# Patient Record
Sex: Male | Born: 1960 | Race: White | Hispanic: No | Marital: Married | Smoking: Former smoker
Health system: Southern US, Community
[De-identification: ages and names within clinical notes are randomized; demographics above are authoritative.]

## PROBLEM LIST (undated history)

## (undated) DIAGNOSIS — N50819 Testicular pain, unspecified: Secondary | ICD-10-CM

## (undated) DIAGNOSIS — E785 Hyperlipidemia, unspecified: Secondary | ICD-10-CM

## (undated) DIAGNOSIS — N451 Epididymitis: Secondary | ICD-10-CM

## (undated) DIAGNOSIS — K219 Gastro-esophageal reflux disease without esophagitis: Secondary | ICD-10-CM

## (undated) DIAGNOSIS — T7840XA Allergy, unspecified, initial encounter: Secondary | ICD-10-CM

## (undated) HISTORY — DX: Allergy, unspecified, initial encounter: T78.40XA

## (undated) HISTORY — DX: Testicular pain, unspecified: N50.819

## (undated) HISTORY — DX: Hyperlipidemia, unspecified: E78.5

## (undated) HISTORY — DX: Epididymitis: N45.1

## (undated) HISTORY — DX: Gastro-esophageal reflux disease without esophagitis: K21.9

---

## 1990-01-27 HISTORY — PX: VASECTOMY: SHX75

## 1992-01-28 HISTORY — PX: NASAL SINUS SURGERY: SHX719

## 2008-10-13 ENCOUNTER — Emergency Department: Payer: Self-pay | Admitting: Emergency Medicine

## 2008-11-24 ENCOUNTER — Ambulatory Visit: Payer: Self-pay | Admitting: Otolaryngology

## 2011-09-26 ENCOUNTER — Ambulatory Visit: Payer: Self-pay | Admitting: Unknown Physician Specialty

## 2012-07-20 ENCOUNTER — Ambulatory Visit: Payer: Self-pay | Admitting: Unknown Physician Specialty

## 2013-02-25 ENCOUNTER — Ambulatory Visit: Payer: Self-pay | Admitting: Gastroenterology

## 2013-03-01 LAB — PATHOLOGY REPORT

## 2013-05-23 ENCOUNTER — Ambulatory Visit: Payer: Self-pay | Admitting: Obstetrics and Gynecology

## 2014-05-19 NOTE — Op Note (Signed)
PATIENT NAME:  Marc MoulderSYLVAIN, [name] MR#:  [id number] DATE OF BIRTH:  06/13/1960  DATE OF PROCEDURE:  07/20/2012  PREOPERATIVE DIAGNOSES: Uvular hypertrophy, chronic tonsillitis.  POSTOPERATIVE DIAGNOSES:  Uvular hypertrophy, chronic tonsillitis.  PROCEDURES PERFORMED: 1.  Uvulopalatopharyngoplasty.  2.  Tonsillectomy.   OPERATIVE FINDINGS: Elongated edematous uvula and large cryptic inflamed tonsils.   DESCRIPTION OF PROCEDURE: Ebony HailDarren was identified in the holding area, taken to the operating room and placed in the supine position. After general endotracheal anesthesia, the table was turned 45 degrees. The patient was draped in the usual fashion for tonsillectomy.  A mouth gag was inserted in the oral cavity. Examination of the oropharynx showed an elongated, edematous uvula approximately 2-1/2 times the normal size. He was also noted to have chronic cryptic tonsillitis that was also involving portions of the soft palate. Based on this examination, I felt that tonsillectomy would be warranted. Therefore, I got Mrs. Corson on the phone, described the situation to her and recommended that we proceed with tonsillectomy in addition to the palatoplasty. I described the risks and benefits including bleeding and infection and she wanted us to proceed. Therefore, beginning on the left-hand side, a tenaculum was used to grasp the tonsil and the Bovie cautery used to dissect it free from the fossa. There were several small bleeding vessels which were cauterized. The tonsil was then taken from the anterior pillar to the posterior pillar in a beveled fashion.  In a similar fashion, the right tonsil was removed. Again, meticulous hemostasis was achieved using the Bovie cautery. With the tonsils removed, the 15 blade was then used to create an incision from the anterior tonsillar pillar on the left to the anterior tonsillar pillar on the right. This incision was then beveled through the uvular portion of the uvular  muscle and beveled posteriorly to the posterior tonsillar pillar.  The uvula was thus removed. A pharyngoplasty was then created by using 4-0 Vicryls in interrupted fashion beginning from posteriorly bringing the posterior pillar and posterior mucosal edge up to the anterior edge and sewing the pharyngeal mucosa back together, including the anterior-posterior tonsillar pillars laterally. This gave excellent removal of the uvula and beautiful closure of the palate. The pharynx was then copiously irrigated with saline. Any small bleeding points were cauterized using the suction cautery. A local anesthetic of 0.5% plain Marcaine was then used to inject the soft palate as well as the tonsillar fossa. A total of 6 mL was used. The patient was then returned to anesthesia where he was awakened in the operating room and taken to the recovery room in stable condition.   CULTURES: None.   SPECIMENS: Tonsils and uvula.   ESTIMATED BLOOD LOSS: Less than 30 mL. ____________________________ Davina Pokehapman T. Corby Villasenor, MD ctm:sb D: 07/20/2012 08:15:57 ET T: 07/20/2012 10:03:25 ET JOB#: [id number]  cc: Davina Pokehapman T. Dekker Verga, MD, <Dictator> Davina PokeHAPMAN T Mozell Haber MD ELECTRONICALLY SIGNED 08/13/2012 7:53

## 2014-11-23 ENCOUNTER — Other Ambulatory Visit: Payer: Self-pay | Admitting: Unknown Physician Specialty

## 2014-11-23 DIAGNOSIS — H9123 Sudden idiopathic hearing loss, bilateral: Secondary | ICD-10-CM

## 2014-12-06 ENCOUNTER — Ambulatory Visit
Admission: RE | Admit: 2014-12-06 | Discharge: 2014-12-06 | Disposition: A | Discharge status: Home / Self Care | Payer: BLUE CROSS/BLUE SHIELD | Source: Ambulatory Visit | Attending: Unknown Physician Specialty | Admitting: Unknown Physician Specialty

## 2014-12-06 DIAGNOSIS — H9123 Sudden idiopathic hearing loss, bilateral: Secondary | ICD-10-CM

## 2014-12-20 ENCOUNTER — Other Ambulatory Visit: Payer: Self-pay | Admitting: Unknown Physician Specialty

## 2014-12-20 DIAGNOSIS — H9123 Sudden idiopathic hearing loss, bilateral: Secondary | ICD-10-CM

## 2014-12-22 ENCOUNTER — Ambulatory Visit
Admission: RE | Admit: 2014-12-22 | Discharge: 2014-12-22 | Disposition: A | Discharge status: Home / Self Care | Payer: BLUE CROSS/BLUE SHIELD | Source: Ambulatory Visit | Attending: Unknown Physician Specialty | Admitting: Unknown Physician Specialty

## 2014-12-22 DIAGNOSIS — J322 Chronic ethmoidal sinusitis: Secondary | ICD-10-CM | POA: Diagnosis not present

## 2014-12-22 DIAGNOSIS — H9123 Sudden idiopathic hearing loss, bilateral: Secondary | ICD-10-CM | POA: Diagnosis not present

## 2014-12-22 MED ORDER — GADOBENATE DIMEGLUMINE 529 MG/ML IV SOLN
20.0000 mL | Freq: Once | INTRAVENOUS | Status: AC | PRN
  Administered 2014-12-22: 20 mL via INTRAVENOUS

## 2015-02-06 ENCOUNTER — Encounter: Payer: Self-pay | Admitting: *Deleted

## 2015-02-06 DIAGNOSIS — K219 Gastro-esophageal reflux disease without esophagitis: Secondary | ICD-10-CM | POA: Insufficient documentation

## 2015-02-06 DIAGNOSIS — T7840XA Allergy, unspecified, initial encounter: Secondary | ICD-10-CM | POA: Insufficient documentation

## 2015-02-06 DIAGNOSIS — E785 Hyperlipidemia, unspecified: Secondary | ICD-10-CM | POA: Insufficient documentation

## 2015-02-06 HISTORY — DX: Allergy, unspecified, initial encounter: T78.40XA

## 2015-02-06 HISTORY — DX: Gastro-esophageal reflux disease without esophagitis: K21.9

## 2015-02-06 HISTORY — DX: Hyperlipidemia, unspecified: E78.5

## 2015-02-07 ENCOUNTER — Encounter: Payer: Self-pay | Admitting: Obstetrics and Gynecology

## 2015-02-07 ENCOUNTER — Ambulatory Visit (INDEPENDENT_AMBULATORY_CARE_PROVIDER_SITE_OTHER): Payer: BLUE CROSS/BLUE SHIELD | Admitting: Obstetrics and Gynecology

## 2015-02-07 VITALS — BP 137/90 | HR 77 | Resp 16 | Ht 69.0 in | Wt 252.3 lb

## 2015-02-07 DIAGNOSIS — N503 Cyst of epididymis: Secondary | ICD-10-CM

## 2015-02-07 DIAGNOSIS — N50819 Testicular pain, unspecified: Secondary | ICD-10-CM | POA: Diagnosis not present

## 2015-02-07 LAB — URINALYSIS, COMPLETE
Bilirubin, UA: NEGATIVE
GLUCOSE, UA: NEGATIVE
KETONES UA: NEGATIVE
LEUKOCYTES UA: NEGATIVE
NITRITE UA: NEGATIVE
PROTEIN UA: NEGATIVE
SPEC GRAV UA: 1.02 (ref 1.005–1.030)
Urobilinogen, Ur: 0.2 mg/dL (ref 0.2–1.0)
pH, UA: 5.5 (ref 5.0–7.5)

## 2015-02-07 LAB — MICROSCOPIC EXAMINATION
BACTERIA UA: NONE SEEN
WBC, UA: NONE SEEN /hpf (ref 0–?)

## 2015-02-07 NOTE — Progress Notes (Signed)
02/07/2015 10:55 AM   Jermaine Hulen LusterP Weems 06-Oct-1960 [id number]  Referring provider: Jerl MinaJames Hedrick, MD [address] [facility] [place], [place] [id number]  Chief Complaint  Patient presents with  . Testicle Pain    HPI:  Shows a 55 year old male with a history of chronic intermittent left testicular pain. He has been previously diagnosed multiple times with epididymitis. He was seen in our clinic almost 2 years ago for evaluation of same scrotal ultrasound was performed noting a small left epididymal/possible spermatocele. Was no evidence of epididymitis on his ultrasound at that time. He describes pain as intermittent soreness. He reports that symptoms are worse after he rides in the car for long periods of time. He denies any fevers or urinary symptoms. He is wondering if he should continue to be prescribed antibiotics or if they are truly necessary. He is not taking any other medications for his intermittent discomfort.   Past surgical history includes vasectomy.   PMH: Past Medical History  Diagnosis Date  . Allergic state 02/06/2015  . Acid reflux 02/06/2015  . HLD (hyperlipidemia) 02/06/2015  . Testicular pain   . Epididymitis     Surgical History: Past Surgical History  Procedure Laterality Date  . Vasectomy  1992  . Nasal sinus surgery  1994    Home Medications:    Medication List       This list is accurate as of: 02/07/15 11:59 PM.  Always use your most recent med list.               CRESTOR 20 MG tablet  Generic drug:  rosuvastatin  Take by mouth.     lansoprazole 15 MG capsule  Commonly known as:  PREVACID  Take by mouth.     sertraline 50 MG tablet  Commonly known as:  ZOLOFT  Take by mouth.        Allergies:  Allergies  Allergen Reactions  . Iodinated Diagnostic Agents Anaphylaxis  . Codeine     Other reaction(s): Unknown    Family History: Family History  Problem Relation Age of Onset  . Colon cancer Father     Social History:   reports that he quit smoking about 4 years ago. He does not have any smokeless tobacco history on file. His alcohol and drug histories are not on file.  ROS: UROLOGY Frequent Urination?: No Hard to postpone urination?: No Burning/pain with urination?: No Get up at night to urinate?: No Leakage of urine?: No Urine stream starts and stops?: No Trouble starting stream?: No Do you have to strain to urinate?: No Blood in urine?: No Urinary tract infection?: No Sexually transmitted disease?: No Injury to kidneys or bladder?: No Painful intercourse?: No Weak stream?: No Erection problems?: No Penile pain?: No  Gastrointestinal Nausea?: No Vomiting?: No Indigestion/heartburn?: No Diarrhea?: No Constipation?: No  Constitutional Fever: No Night sweats?: No Weight loss?: No Fatigue?: No  Skin Skin rash/lesions?: No Itching?: No  Eyes Blurred vision?: No Double vision?: No  Ears/Nose/Throat Sore throat?: No Sinus problems?: No  Hematologic/Lymphatic Swollen glands?: No Easy bruising?: No  Cardiovascular Leg swelling?: No Chest pain?: No  Respiratory Cough?: No Shortness of breath?: No  Endocrine Excessive thirst?: No  Musculoskeletal Back pain?: No Joint pain?: No  Neurological Headaches?: No Dizziness?: No  Psychologic Depression?: No Anxiety?: No  Physical Exam: BP 137/90 mmHg  Pulse 77  Resp 16  Ht 5\' 9"  (1.753 m)  Wt 252 lb 4.8 oz (114.443 kg)  BMI 37.24 kg/m2  Constitutional:  Alert and oriented, No acute distress. HEENT: [place] AT, moist mucus membranes.  Trachea midline, no masses. Cardiovascular: No clubbing, cyanosis, or edema. Respiratory: Normal respiratory effort, no increased work of breathing. GI: Abdomen is soft, nontender, nondistended, no abdominal masses GU: Normal circumcised phallus, testicles descended bilaterally, small palpable well-circumscribed rubbery mass palpable superior to left testicle, minimal tenderness noted, no  scrotal erythema or swelling Skin: No rashes, bruises or suspicious lesions. Neurologic: Grossly intact, no focal deficits, moving all 4 extremities. Psychiatric: Normal mood and affect.  Laboratory Data:   Urinalysis    Component Value Date/Time   GLUCOSEU Negative 02/07/2015 1419   BILIRUBINUR Negative 02/07/2015 1419   NITRITE Negative 02/07/2015 1419   LEUKOCYTESUR Negative 02/07/2015 1419    Pertinent Imaging:  Assessment & Plan:    1.Scrotal pain-  Scrotal US 1 year ago demonstrated left epididymal cyst vs spermatocele.  It has remained stable in size but patient continues to experience intermittent pain especially after long bumpy car rides.  Patient reassured that his intermittent scrotal pain is most likely due to his small epididymal cyst/spermatocele seen on previous scrotal ultrasound. I recommended that he take anti-inflammatories and apply cool compresses as needed. It is unlikely that his symptoms are due to recurrent epididymitis given that he does not experience any increased swelling during these episodes or more severe pain. I encouraged him to continue to monitor the area and return if symptoms worsen. He understands that surgical removal is an option in the future should he become very bothersome or more severe.  There are no diagnoses linked to this encounter.  Return if symptoms worsen or fail to improve.  These notes generated with voice recognition software. I apologize for typographical errors.  Earlie Lou, FNP  [facility] [address] [place], [place] [address] [phone]

## 2015-08-28 ENCOUNTER — Emergency Department: Payer: BLUE CROSS/BLUE SHIELD

## 2015-08-28 ENCOUNTER — Ambulatory Visit (INDEPENDENT_AMBULATORY_CARE_PROVIDER_SITE_OTHER)
Admission: EM | Admit: 2015-08-28 | Discharge: 2015-08-28 | Disposition: A | Discharge status: Home / Self Care | Payer: BLUE CROSS/BLUE SHIELD | Source: Home / Self Care | Attending: Family Medicine | Admitting: Family Medicine

## 2015-08-28 ENCOUNTER — Emergency Department
Admission: EM | Admit: 2015-08-28 | Discharge: 2015-08-28 | Disposition: A | Discharge status: Home / Self Care | Payer: BLUE CROSS/BLUE SHIELD | Attending: Emergency Medicine | Admitting: Emergency Medicine

## 2015-08-28 ENCOUNTER — Encounter: Payer: Self-pay | Admitting: Medical Oncology

## 2015-08-28 DIAGNOSIS — Z885 Allergy status to narcotic agent status: Secondary | ICD-10-CM | POA: Insufficient documentation

## 2015-08-28 DIAGNOSIS — Z87891 Personal history of nicotine dependence: Secondary | ICD-10-CM | POA: Insufficient documentation

## 2015-08-28 DIAGNOSIS — Z79899 Other long term (current) drug therapy: Secondary | ICD-10-CM | POA: Insufficient documentation

## 2015-08-28 DIAGNOSIS — Z800 Family history of malignant neoplasm of digestive organs: Secondary | ICD-10-CM | POA: Insufficient documentation

## 2015-08-28 DIAGNOSIS — R079 Chest pain, unspecified: Secondary | ICD-10-CM | POA: Diagnosis not present

## 2015-08-28 DIAGNOSIS — R0789 Other chest pain: Secondary | ICD-10-CM

## 2015-08-28 DIAGNOSIS — K219 Gastro-esophageal reflux disease without esophagitis: Secondary | ICD-10-CM | POA: Insufficient documentation

## 2015-08-28 DIAGNOSIS — E785 Hyperlipidemia, unspecified: Secondary | ICD-10-CM

## 2015-08-28 LAB — TROPONIN I

## 2015-08-28 LAB — BASIC METABOLIC PANEL
ANION GAP: 7 (ref 5–15)
BUN: 16 mg/dL (ref 6–20)
CALCIUM: 9.6 mg/dL (ref 8.9–10.3)
CHLORIDE: 109 mmol/L (ref 101–111)
CO2: 24 mmol/L (ref 22–32)
Creatinine, Ser: 0.92 mg/dL (ref 0.61–1.24)
GFR calc non Af Amer: >60 mL/min (ref >60)
Glucose, Bld: 139 mg/dL — ABNORMAL HIGH (ref 65–99)
POTASSIUM: 4.1 mmol/L (ref 3.5–5.1)
Sodium: 140 mmol/L (ref 135–145)

## 2015-08-28 LAB — CBC
HEMATOCRIT: 45.2 % (ref 40.0–52.0)
HEMOGLOBIN: 16 g/dL (ref 13.0–18.0)
MCH: 30.5 pg (ref 26.0–34.0)
MCHC: 35.4 g/dL (ref 32.0–36.0)
MCV: 86.2 fL (ref 80.0–100.0)
Platelets: 155 10*3/uL (ref 150–440)
RBC: 5.24 MIL/uL (ref 4.40–5.90)
RDW: 12.7 % (ref 11.5–14.5)
WBC: 7.7 10*3/uL (ref 3.8–10.6)

## 2015-08-28 MED ORDER — ASPIRIN 81 MG PO CHEW
324.0000 mg | CHEWABLE_TABLET | Freq: Once | ORAL | Status: AC
  Administered 2015-08-28: 324 mg via ORAL

## 2015-08-28 NOTE — Discharge Instructions (Signed)
Please seek medical attention for any high fevers, chest pain, shortness of breath, change in behavior, persistent vomiting, bloody stool or any other new or concerning symptoms.  

## 2015-08-28 NOTE — ED Triage Notes (Signed)
Patient complains of chest pain that started yesterday around 6 pm. Patient states that pain kept him up all night. Patient states that the chest pain is loacted in his central chest and radiates into upper chest. Patient states that has been also having some back pain.

## 2015-08-28 NOTE — ED Provider Notes (Signed)
[facility]    CSN: [id number] Arrival date & time: 08/28/15  1355  First Provider Contact:  First MD Initiated Contact with Patient 08/28/15 1434        History   Chief Complaint Chief Complaint  Patient presents with  . Chest Pain    HPI Marc Frye is a 55 y.o. male.   Patient's here because of chest pain. He states that he's had midepigastric chest pain that does radiate somewhat to the back. He states this morning on off-and-on for 4 days last night apparently was more intense and he was unable to sleep last night. He denies any shortness of breath injury to his chest previous history of chest pain. No known cardiac disease or cardiac problems. He does state he has a bad history of GERD he does have hyperlipidemia and his had a history of epididymitis and testicular pain. He states that he's been taking Zantac but has not helped him any for this pain. He states the massage the area feels like he can reproduce the pain. Family medical history he is not sure of his past family medical history states he moved away at young man. He states his father did have colon cancer and a nonmalignant brain tumor No known drug allergies however he does have hyperlipidemia he does not have hypertension he used to smoke but stopped about 3 years ago.  He describes the pain now being about 5 out of 7.     The history is provided by the patient and the spouse.  Chest Pain    Past Medical History:  Diagnosis Date  . Acid reflux 02/06/2015  . Allergic state 02/06/2015  . Epididymitis   . HLD (hyperlipidemia) 02/06/2015  . Testicular pain     Patient Active Problem List   Diagnosis Date Noted  . Allergic state 02/06/2015  . Acid reflux 02/06/2015  . HLD (hyperlipidemia) 02/06/2015    Past Surgical History:  Procedure Laterality Date  . NASAL SINUS SURGERY  1994  . VASECTOMY  1992       Home Medications    Prior to Admission medications   Medication Sig Start  Date End Date Taking? Authorizing Provider  lansoprazole (PREVACID) 15 MG capsule Take by mouth.   Yes Historical Provider, MD  rosuvastatin (CRESTOR) 20 MG tablet Take by mouth. 12/14/14 12/14/15 Yes Historical Provider, MD  sertraline (ZOLOFT) 50 MG tablet Take by mouth. 07/20/14  Yes Historical Provider, MD    Family History Family History  Problem Relation Age of Onset  . Colon cancer Father     Social History Social History  Substance Use Topics  . Smoking status: Former Smoker    Quit date: 02/06/2011  . Smokeless tobacco: Never Used  . Alcohol use No     Allergies   Iodinated diagnostic agents and Codeine   Review of Systems Review of Systems  Cardiovascular: Positive for chest pain.  All other systems reviewed and are negative.    Physical Exam Triage Vital Signs ED Triage Vitals  Enc Vitals Group     BP 08/28/15 1421 (!) 154/79     Pulse Rate 08/28/15 1421 77     Resp 08/28/15 1421 17     Temp 08/28/15 1421 98.1 F (36.7 C)     Temp Source 08/28/15 1421 Oral     SpO2 08/28/15 1421 98 %     Weight 08/28/15 1421 260 lb (117.9 kg)     Height 08/28/15 1421 5\' 9"  (  1.753 m)     Head Circumference --      Peak Flow --      Pain Score 08/28/15 1423 5     Pain Loc --      Pain Edu? --      Excl. in GC? --    No data found.   Updated Vital Signs BP (!) 154/79   Pulse 77   Temp 98.1 F (36.7 C) (Oral)   Resp 17   Ht  (1.753 m)   Wt 260 lb (117.9 kg)   SpO2 98%   BMI 38.40 kg/m   Visual Acuity Right Eye Distance:   Left Eye Distance:   Bilateral Distance:    Right Eye Near:   Left Eye Near:    Bilateral Near:     Physical Exam  Constitutional: He is oriented to person, place, and time. He appears well-developed and well-nourished. No distress.  HENT:  Head: Normocephalic and atraumatic.  Eyes: Pupils are equal, round, and reactive to light.  Neck: Normal range of motion. Neck supple.  Cardiovascular: Normal rate and regular rhythm.     No murmur heard. Pulmonary/Chest: Effort normal and breath sounds normal.    Patient has mid epigastric pain and tenderness over the lower sternum consistent with the chest pain that he was having  Abdominal: Soft.  Musculoskeletal: Normal range of motion.  Neurological: He is alert and oriented to person, place, and time.  Skin: Skin is warm. He is not diaphoretic.  Psychiatric: He has a normal mood and affect.     UC Treatments / Results  Labs (all labs ordered are listed, but only abnormal results are displayed) Labs Reviewed - No data to display  EKG  EKG Interpretation None      ED ECG REPORT I, Perle Gibbon H, the attending physician, personally viewed and interpreted this ECG.   Date: 08/28/2015  EKG Time:14:21:33  Rate:73  Rhythm: normal EKG, normal sinus rhythm, there are no previous tracings available for comparison  Axis: 44  Intervals:none  ST&T Change: none Radiology No results found.  Procedures Procedures (including critical care time)  Medications Ordered in UC Medications  aspirin chewable tablet 324 mg (324 mg Oral Given 08/28/15 1518)     Initial Impression / Assessment and Plan / UC Course  I have reviewed the triage vital signs and the nursing notes.  Pertinent labs & imaging results that were available during my care of the patient were reviewed by me and considered in my medical decision making (see chart for details).  Clinical Course    I've explained patient and his wife that I think he has costochondritis and chest wall tenderness Y I cannot explain it but best what I think. However of also complained to him that he is a 55 year old white male. Generally white males over 3540, max chest pain UC for those people to the ER since this is urgent care. Explained that the standard of care would Rj Pedrosa tiled aren't pale these 2 negative cardiac enzymes to make sure this been no heart damage. Was EKG was normal and it does not appear that he is  in any cardiac distress explained to him I cannot give him that reassurance.  Patient wife given option of treating him for costochondritis or going to the emergency room to make sure that there is no cardiac problem and they've opted to go to the emergency room of the choice which is [facility]. Judeth Cornfield charge nurse [facility] ED notify  Ann of patient's eminent arrival via private car.  Final Clinical Impressions(s) / UC Diagnoses   Final diagnoses:  Chest pain, unspecified chest pain type  Chest wall pain    New Prescriptions Discharge Medication List as of 08/28/2015  3:20 PM       Hassan Rowan, MD 08/28/15 1539

## 2015-08-28 NOTE — ED Triage Notes (Signed)
Pt reports that he has been having central chest heaviness that began about 4 days ago and has progressively worsened. Pt reports pain radiates into his back. Denies sob. Was seen at urgent care pta.

## 2015-08-28 NOTE — ED Provider Notes (Signed)
[facility] Emergency Department Provider Note    ____________________________________________   I have reviewed the triage vital signs and the nursing notes.   HISTORY  Chief Complaint Chest Pain   History limited by: Not Limited   HPI Marc Frye is a 55 y.o. male who presents to the emergency department today because of concerns for chest pain. He was seen earlier at urgent care. They sent him to the emergency department. He states that the pain is been going on for 4 days. It is located in his lower mid chest. He states that prior to the pain he was moving floor tiles and a 40 pound dog. He denies any associated shortness breath. No diaphoresis. No pain in his neck or arms.No fevers or shortness breath.     Past Medical History:  Diagnosis Date  . Acid reflux 02/06/2015  . Allergic state 02/06/2015  . Epididymitis   . HLD (hyperlipidemia) 02/06/2015  . Testicular pain     Patient Active Problem List   Diagnosis Date Noted  . Allergic state 02/06/2015  . Acid reflux 02/06/2015  . HLD (hyperlipidemia) 02/06/2015    Past Surgical History:  Procedure Laterality Date  . NASAL SINUS SURGERY  1994  . VASECTOMY  1992    Prior to Admission medications   Medication Sig Start Date End Date Taking? Authorizing Provider  lansoprazole (PREVACID) 15 MG capsule Take by mouth.    Historical Provider, MD  rosuvastatin (CRESTOR) 20 MG tablet Take by mouth. 12/14/14 12/14/15  Historical Provider, MD  sertraline (ZOLOFT) 50 MG tablet Take by mouth. 07/20/14   Historical Provider, MD    Allergies Iodinated diagnostic agents and Codeine  Family History  Problem Relation Age of Onset  . Colon cancer Father     Social History Social History  Substance Use Topics  . Smoking status: Former Smoker    Quit date: 02/06/2011  . Smokeless tobacco: Never Used  . Alcohol use No    Review of Systems  Constitutional: Negative for fever. Cardiovascular:  Positive for chest pain Respiratory: Negative for shortness of breath. Gastrointestinal: Negative for abdominal pain, vomiting and diarrhea. Neurological: Negative for headaches, focal weakness or numbness.   10-point ROS otherwise negative.  ____________________________________________   PHYSICAL EXAM:  VITAL SIGNS: ED Triage Vitals  Enc Vitals Group     BP 08/28/15 1550 (!) 156/93     Pulse Rate 08/28/15 1550 73     Resp 08/28/15 1550 20     Temp 08/28/15 1550 98.5 F (36.9 C)     Temp Source 08/28/15 1550 Oral     SpO2 08/28/15 1550 98 %     Weight 08/28/15 1550 260 lb (117.9 kg)     Height 08/28/15 1550  (1.753 m)     Head Circumference --      Peak Flow --      Pain Score 08/28/15 1555 5   Constitutional: Alert and oriented. Well appearing and in no distress. Eyes: Conjunctivae are normal. PERRL. Normal extraocular movements. ENT   Head: Normocephalic and atraumatic.   Nose: No congestion/rhinnorhea.   Mouth/Throat: Mucous membranes are moist.   Neck: No stridor. Hematological/Lymphatic/Immunilogical: No cervical lymphadenopathy. Cardiovascular: Normal rate, regular rhythm.  No murmurs, rubs, or gallops. Respiratory: Normal respiratory effort without tachypnea nor retractions. Breath sounds are clear and equal bilaterally. No wheezes/rales/rhonchi. Gastrointestinal: Soft and nontender. No distention. There is no CVA tenderness. Genitourinary: Deferred Musculoskeletal: Normal range of motion in all extremities. No joint  effusions.  No lower extremity tenderness nor edema. Tender to palpation over the xiphoid process. This does reproduce the pain the patient has been experiencing. Neurologic:  Normal speech and language. No gross focal neurologic deficits are appreciated.  Skin:  Skin is warm, dry and intact. No rash noted. Psychiatric: Mood and affect are normal. Speech and behavior are normal. Patient exhibits appropriate insight and  judgment.  ____________________________________________    LABS (pertinent positives/negatives)  Labs Reviewed  BASIC METABOLIC PANEL - Abnormal; Notable for the following:       Result Value   Glucose, Bld 139 (*)    All other components within normal limits  CBC  TROPONIN I     ____________________________________________   EKG  I, Phineas Semen, attending physician, personally viewed and interpreted this EKG  EKG Time: 1550 Rate: 72 Rhythm: normal sinus rhythm Axis: normal Intervals: qtc 405 QRS: narrow ST changes: no st eelvation Impression: normal ekg   ____________________________________________    RADIOLOGY  CXR IMPRESSION: No active cardiopulmonary disease.  ____________________________________________   PROCEDURES  Procedures  ____________________________________________   INITIAL IMPRESSION / ASSESSMENT AND PLAN / ED COURSE  Pertinent labs & imaging results that were available during my care of the patient were reviewed by me and considered in my medical decision making (see chart for details).  She presents from urgent care today because of concerns for chest pain. It has been going on for the past 4 days. It is worse with palpation to the xiphoid process. At this point I doubt ACS given negative troponin and EKG. Given that the pain has been going on for 4 days I would expect some elevation of the troponin at this point. The patient is tender over the xiphoid process. I think likely costochondritis. I discussed this with the patient. I additionally discussed following up with primary care. ____________________________________________   FINAL CLINICAL IMPRESSION(S) / ED DIAGNOSES  Final diagnoses:  Chest wall pain     Note: This dictation was prepared with Dragon dictation. Any transcriptional errors that result from this process are unintentional    Phineas Semen, MD 08/28/15 1742

## 2017-08-02 IMAGING — MR MR BRAIN/IAC WO/W
10 of 11 series · 40 of 48 positions shown · IV contrast (multihance)
Comparison: None.

CLINICAL DATA: Sudden onset hearing loss in both the ears for 3-4
weeks.

EXAM:
MR BRAIN/IAC WITHOUT AND WITH CONTRAST
TECHNIQUE: Multiplanar, multisequence MR imaging was performed both before and
after administration of intravenous contrast.
CONTRAST:  20mL MULTIHANCE GADOBENATE DIMEGLUMINE 529 MG/ML IV SOLN

[Series 2: T1 · sagittal · 5.0mm · 0.45mm/px · 4 of 23 slices shown (1 of 3)]
[im 1/23]
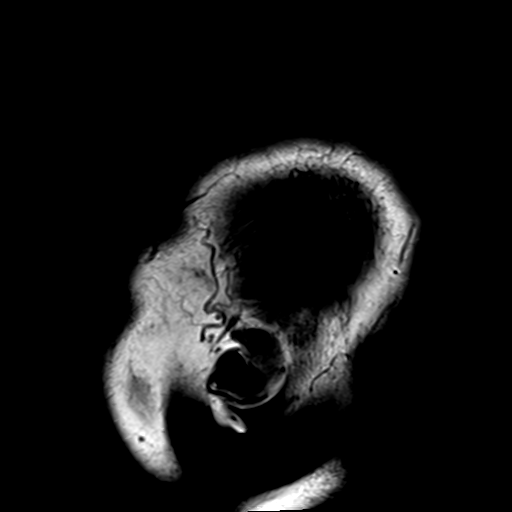
[im 8/23]
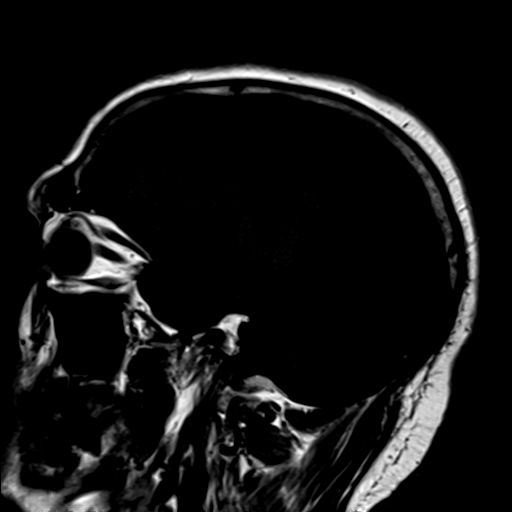
[im 15/23]
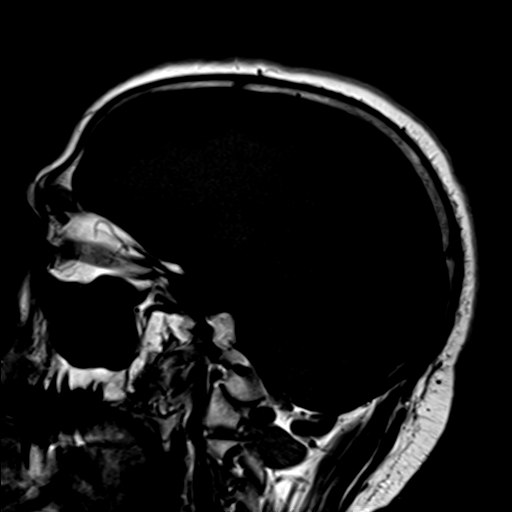
[im 23/23]
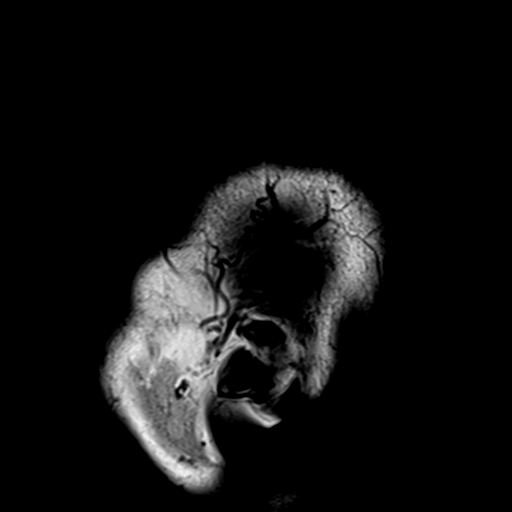

[Series 4: DWI · axial · 3.0mm · 1.20mm/px · z∈[-29,+135]mm · 9 of 57 slices shown (1 of 2)]
[im 1/57]
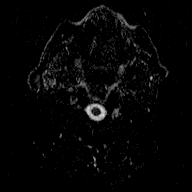
[im 8/57]
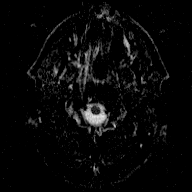
[im 15/57]
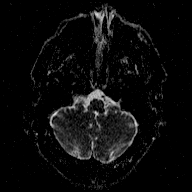
[im 22/57]
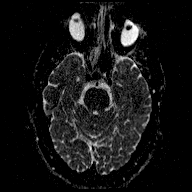
[im 29/57]
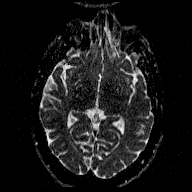
[im 36/57]
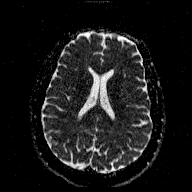
[im 43/57]
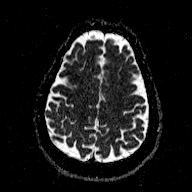
[im 50/57]
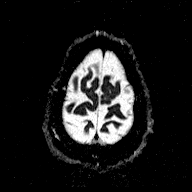
[im 57/57]
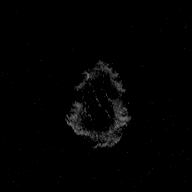

[Series 5: T2 · axial · 5.0mm · 0.72mm/px · z∈[-27,+132]mm · 3 of 26 slices shown]
[im 1/26]
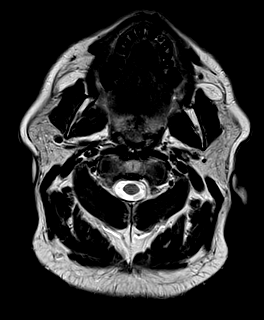
[im 13/26]
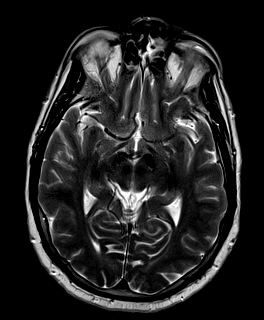
[im 26/26]
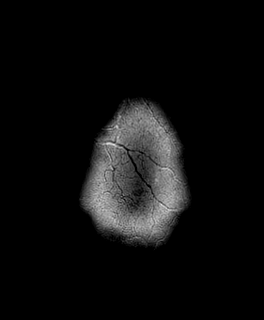

[Series 6: FLAIR · axial · 5.0mm · 0.45mm/px · z∈[-27,+132]mm · 3 of 26 slices shown]
[im 1/26]
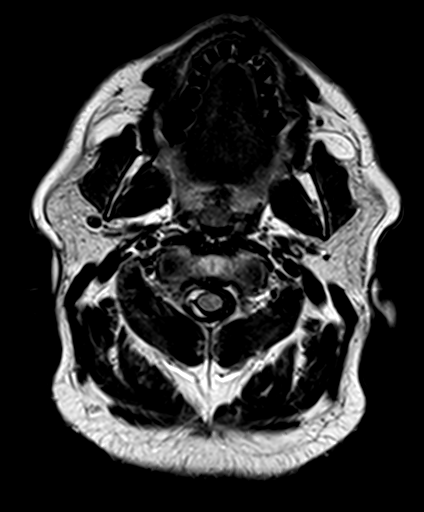
[im 13/26]
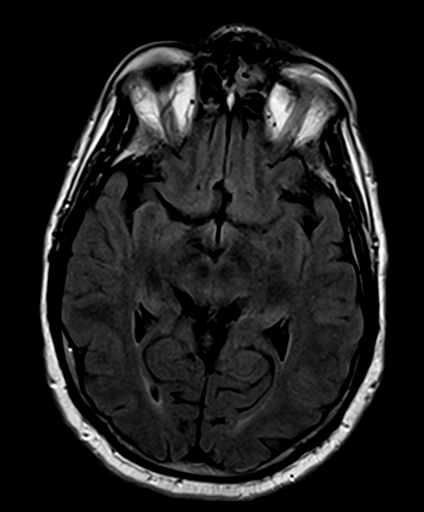
[im 26/26]
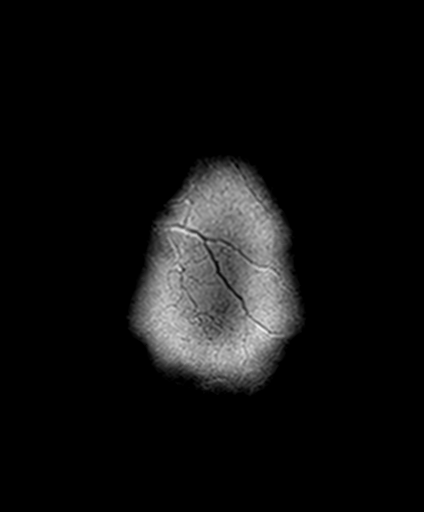

[Series 7: T1 · coronal · 3.0mm · 0.37mm/px · 1 of 11 slices shown (2 of 3)]
[im 1/11]
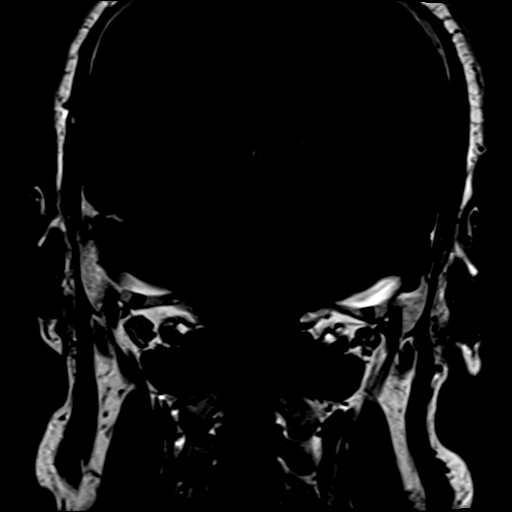

[Series 8: T1 · axial · 3.0mm · 0.37mm/px · 1 of 11 slices shown (3 of 3)]
[im 1/11]
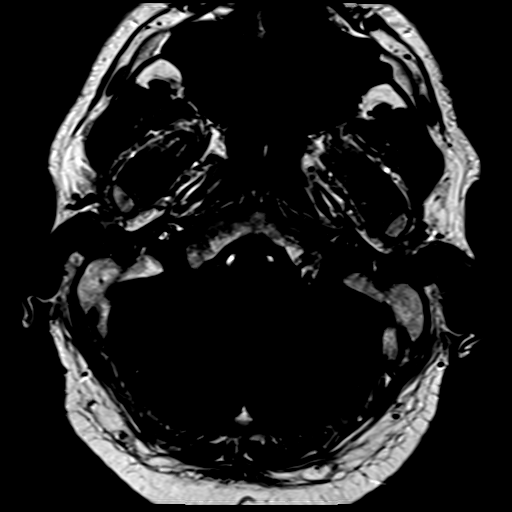

[Series 10: T1 post-contrast · axial · 3.0mm · 0.37mm/px · 1 of 11 slices shown (1 of 3)]
[im 1/11]
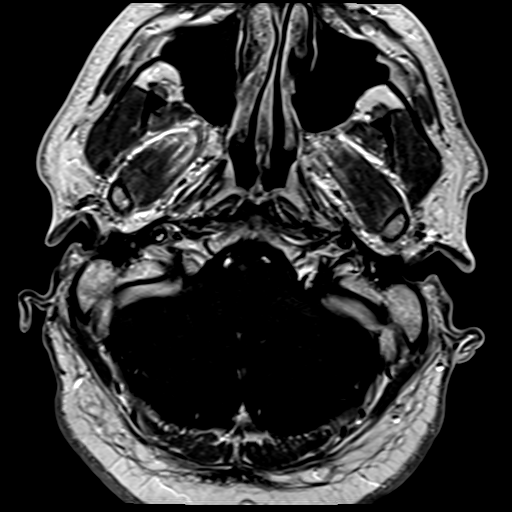

[Series 11: T1 post-contrast · coronal · 3.0mm · 0.37mm/px · 1 of 11 slices shown (2 of 3)]
[im 1/11]
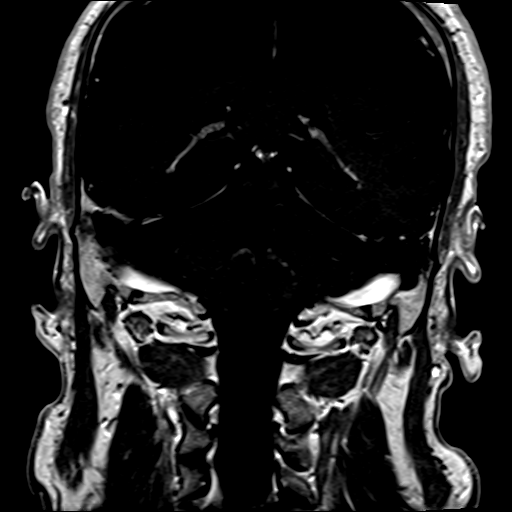

[Series 12: T1 post-contrast · axial · 3.0mm · 1.00mm/px · z∈[-37,+148]mm · 9 of 64 slices shown (3 of 3)]
[im 1/64]
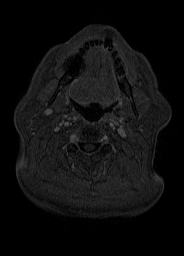
[im 8/64]
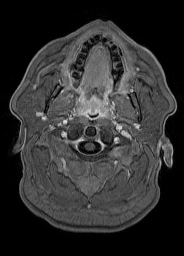
[im 16/64]
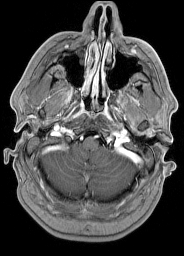
[im 24/64]
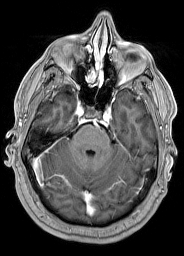
[im 32/64]
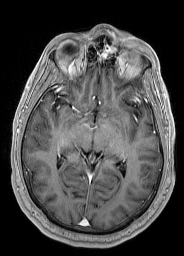
[im 40/64]
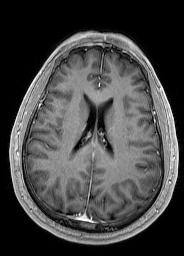
[im 48/64]
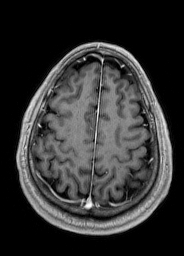
[im 56/64]
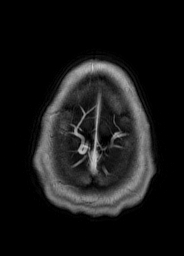
[im 64/64]
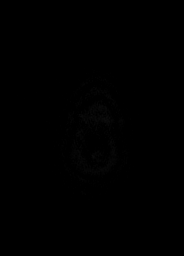

[Series 100: DWI · axial · 3.0mm · 1.20mm/px · z∈[-29,+135]mm · 8 of 57 slices shown (2 of 2)]
[im 1/57]
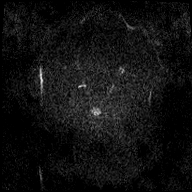
[im 9/57]
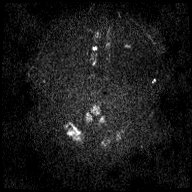
[im 17/57]
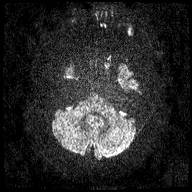
[im 25/57]
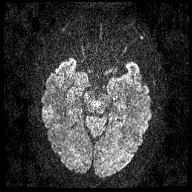
[im 33/57]
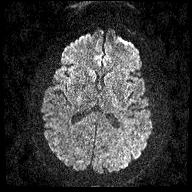
[im 41/57]
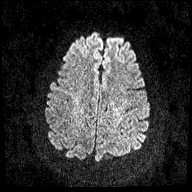
[im 49/57]
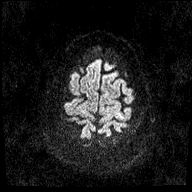
[im 57/57]
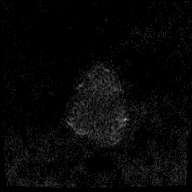

[40 of 48 positions shown; findings below may reference images not displayed]

FINDINGS: Calvarium and upper cervical spine: No focal marrow signal
abnormality.

Orbits: No significant findings.

Sinuses and Mastoids: Small mucous retention cysts in the maxillary
antra. Mild moderate mucosal edema in frontal and ethmoid sinuses
with changes of ethmoidectomy and partial turbinectomies. Probable
(not covered on precontrast T1) mucous retention cyst in the right
frontal sinus, non expansile.

Brain: Symmetric and normal labyrinthine signal and absent
enhancement. Normal appearance of the vestibular cochlear nerves.
Negative brainstem.

No acute or remote infarct, hemorrhage, hydrocephalus, or mass
lesion. No evidence of large vessel occlusion.
IMPRESSION: 1. Negative brain MRI.  No explanation for hearing loss.
2. Frontal and anterior ethmoidal sinusitis.

## 2018-04-08 IMAGING — CR DG CHEST 2V
2 series · 2 of 2 positions shown · non-contrast
Comparison: None.

CLINICAL DATA: Mid epigastric pain radiates to back off and on for
4 days.

EXAM:
CHEST  2 VIEW

[chest pa]
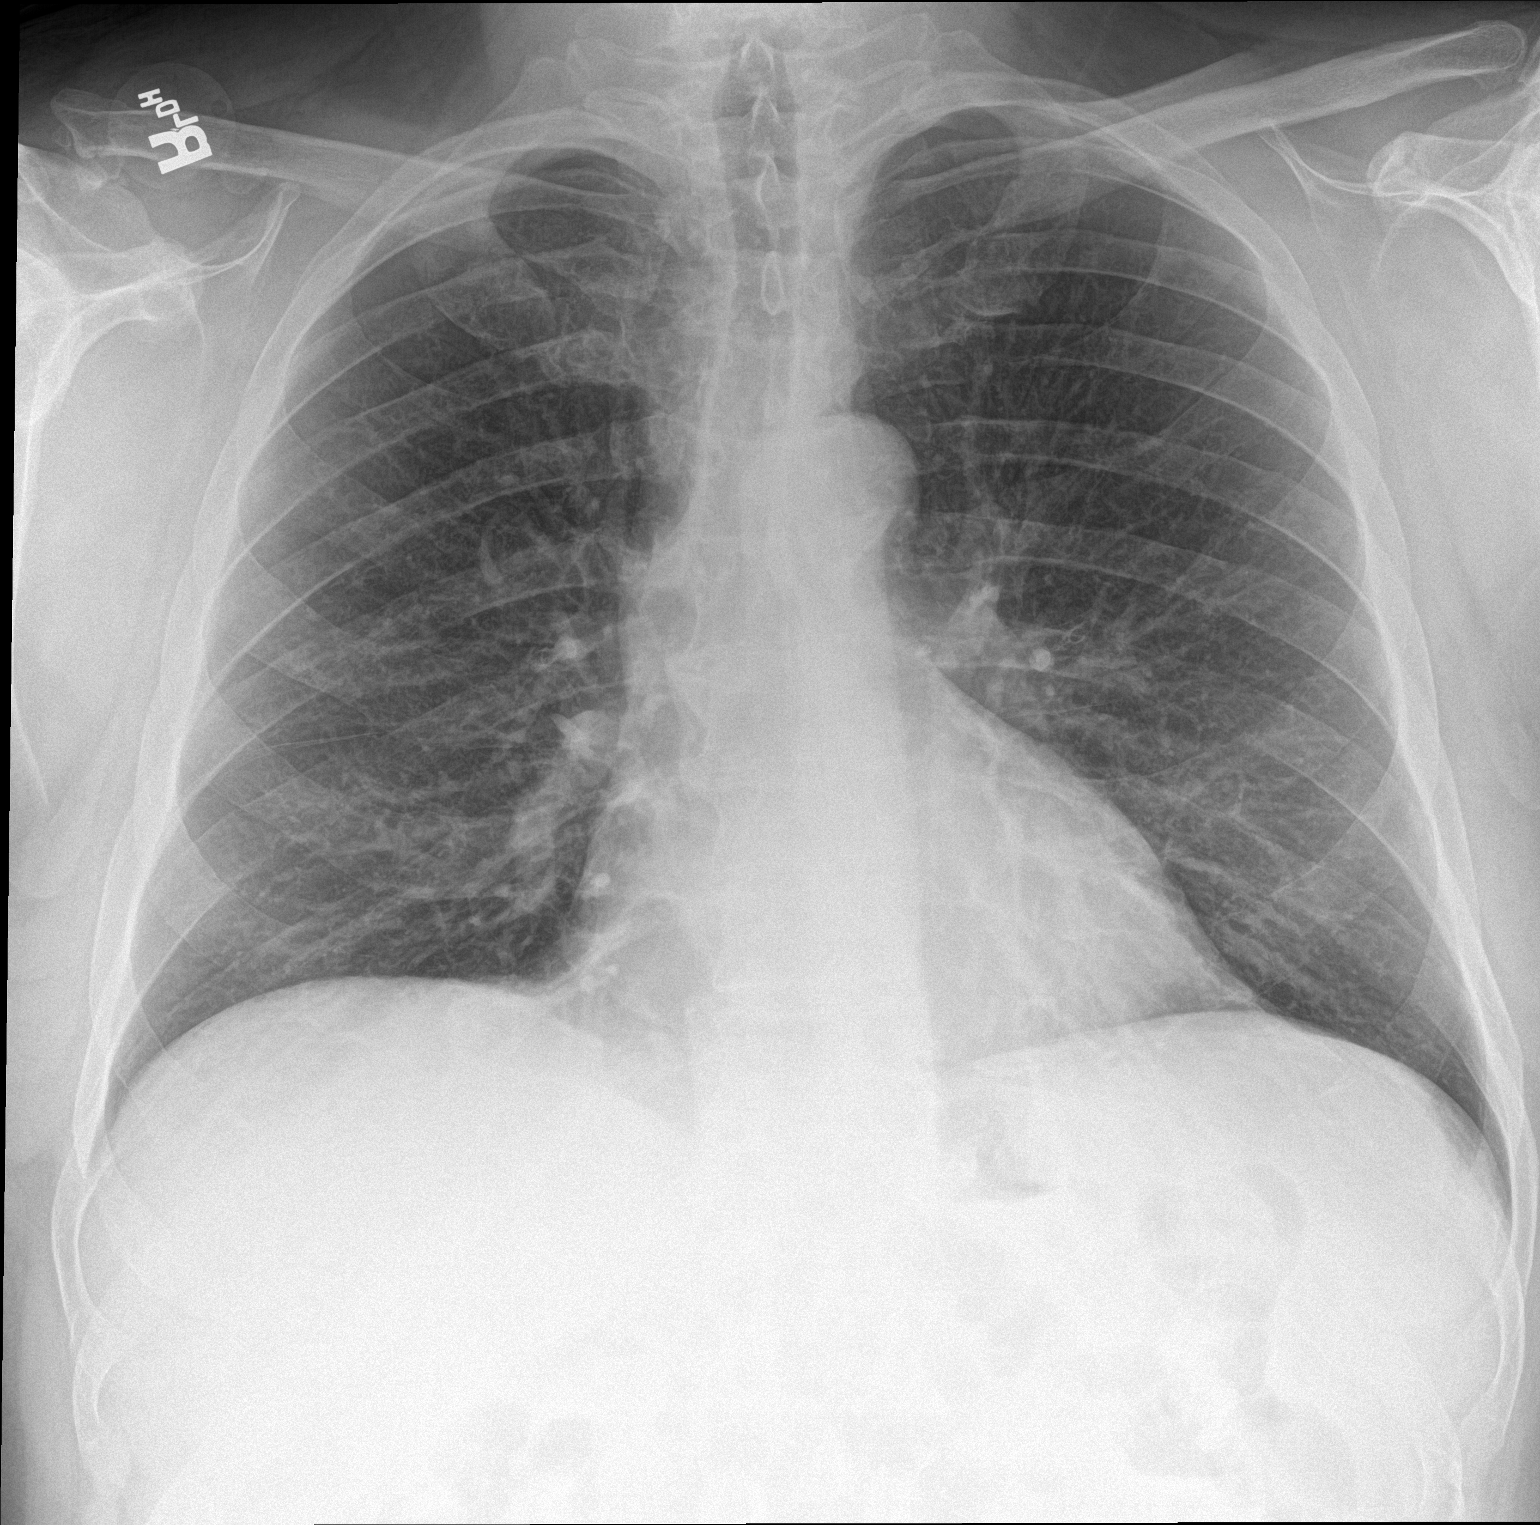

[chest lat]
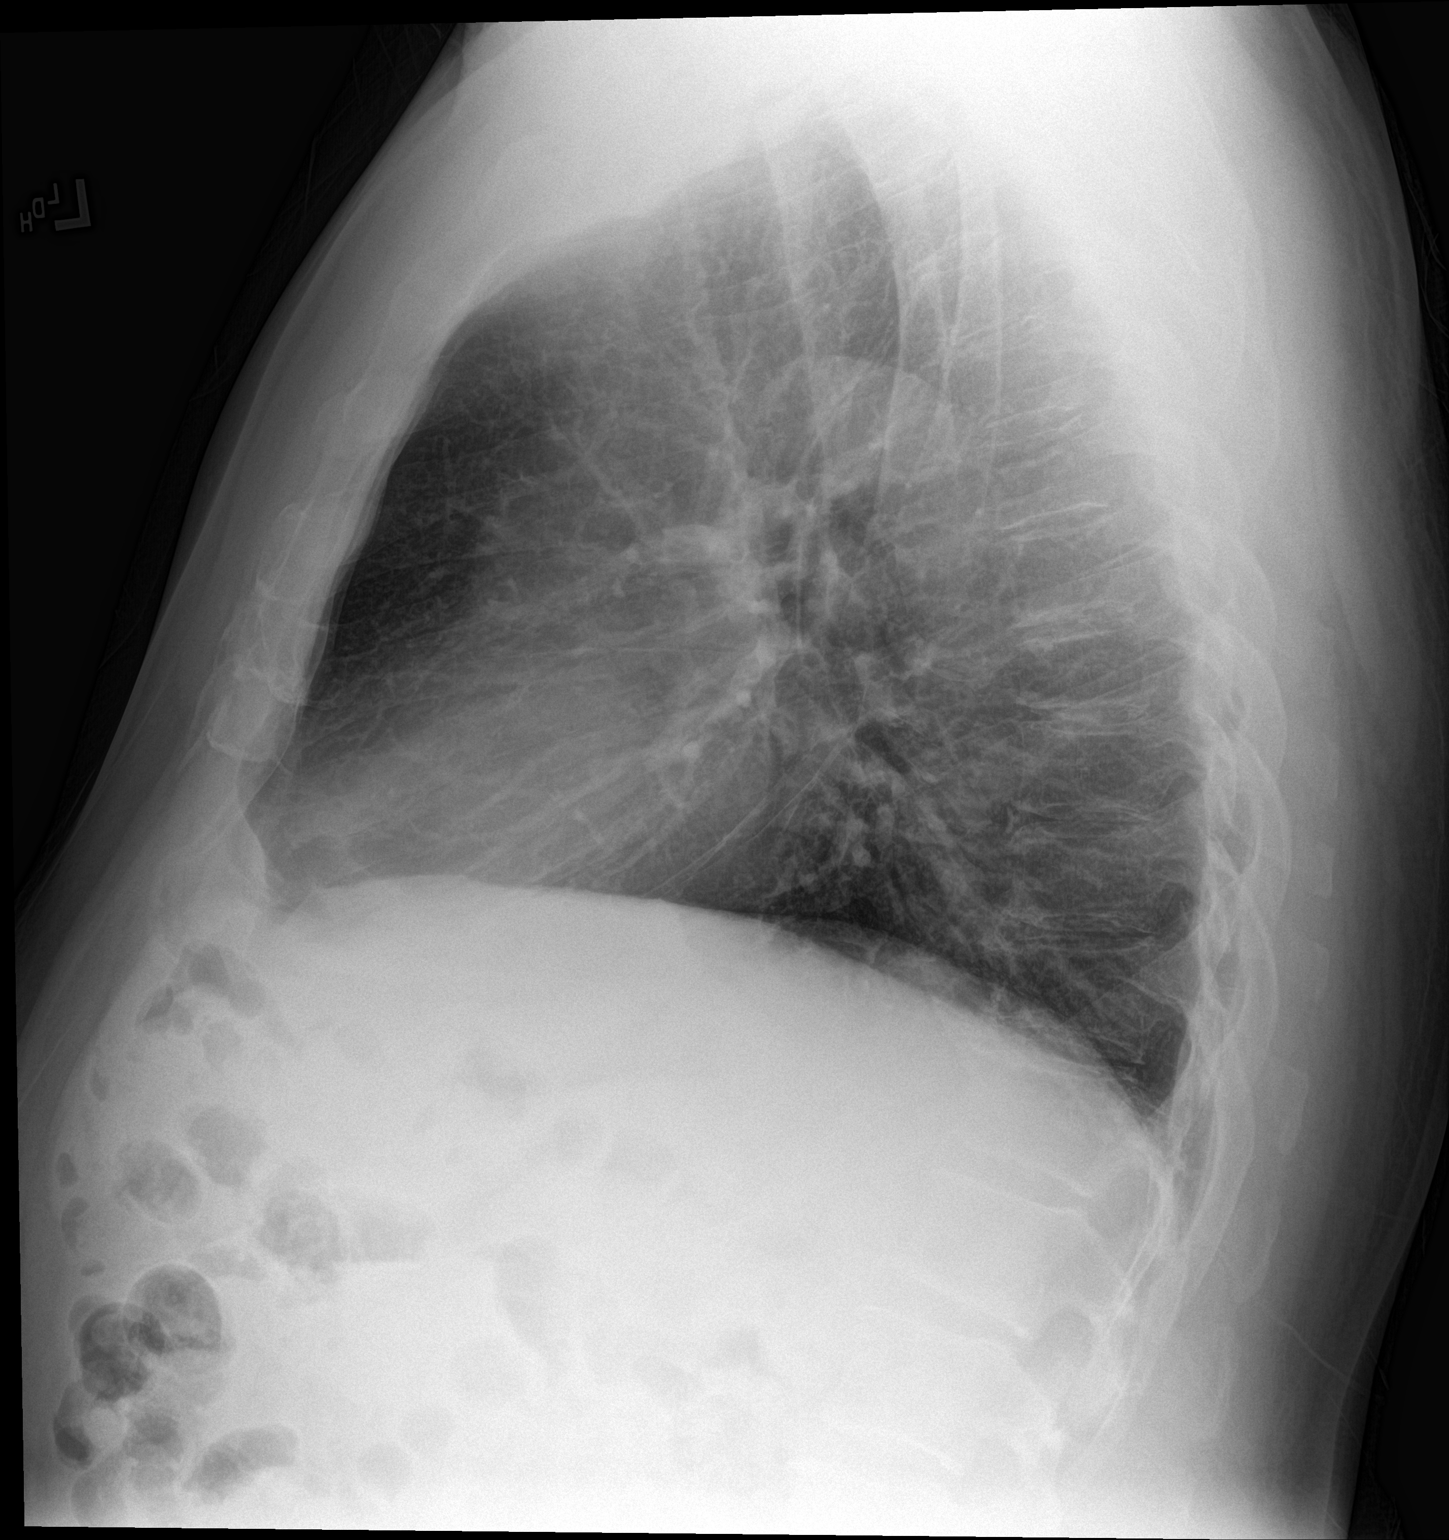

[2 of 2 positions shown; findings below may reference images not displayed]

FINDINGS: The lungs are clear wiithout focal pneumonia, edema, pneumothorax or
pleural effusion. The cardiopericardial silhouette is within normal
limits for size. Postsurgical changes noted distal right clavicle.
IMPRESSION: No active cardiopulmonary disease.
